# Patient Record
Sex: Female | Born: 1975
Health system: Southern US, Community
[De-identification: ages and names within clinical notes are randomized; demographics above are authoritative.]

## PROBLEM LIST (undated history)

## (undated) DIAGNOSIS — N979 Female infertility, unspecified: Secondary | ICD-10-CM

## (undated) DIAGNOSIS — R7989 Other specified abnormal findings of blood chemistry: Secondary | ICD-10-CM

## (undated) HISTORY — DX: Other specified abnormal findings of blood chemistry: R79.89

## (undated) HISTORY — PX: COLONOSCOPY: SHX174

## (undated) HISTORY — DX: Female infertility, unspecified: N97.9

---

## 2021-02-28 ENCOUNTER — Telehealth: Payer: Self-pay | Admitting: Internal Medicine

## 2021-02-28 NOTE — Telephone Encounter (Signed)
Patient was called to see if she could come at 3:00 on Friday instead of 4:00.

## 2021-03-02 ENCOUNTER — Other Ambulatory Visit: Payer: Self-pay

## 2021-03-02 ENCOUNTER — Ambulatory Visit: Payer: BC Managed Care – PPO | Admitting: Internal Medicine

## 2021-03-02 ENCOUNTER — Encounter: Payer: Self-pay | Admitting: Internal Medicine

## 2021-03-02 ENCOUNTER — Ambulatory Visit: Payer: Self-pay | Admitting: Internal Medicine

## 2021-03-02 VITALS — BP 110/70 | HR 72 | Temp 97.6°F | Ht 64.0 in | Wt 131.3 lb

## 2021-03-02 DIAGNOSIS — Z0000 Encounter for general adult medical examination without abnormal findings: Secondary | ICD-10-CM

## 2021-03-02 DIAGNOSIS — H539 Unspecified visual disturbance: Secondary | ICD-10-CM | POA: Diagnosis not present

## 2021-03-02 DIAGNOSIS — Z1231 Encounter for screening mammogram for malignant neoplasm of breast: Secondary | ICD-10-CM

## 2021-03-02 DIAGNOSIS — Z124 Encounter for screening for malignant neoplasm of cervix: Secondary | ICD-10-CM

## 2021-03-02 NOTE — Patient Instructions (Signed)
-  Nice seeing you today!!  -Return fasting for labs.  -Remember your COVID booster and your tetanus booster.  -Schedule follow up in 1 year or sooner as needed.

## 2021-03-02 NOTE — Progress Notes (Signed)
New Patient Office Visit     This visit occurred during the SARS-CoV-2 public health emergency.  Safety protocols were in place, including screening questions prior to the visit, additional usage of staff PPE, and extensive cleaning of exam room while observing appropriate contact time as indicated for disinfecting solutions.    CC/Reason for Visit: Establish care, annual preventive exam Previous PCP: Unknown Last Visit: Unknown  HPI: Dawn Harvey is a 45 y.o. female who is coming in today for the above mentioned reasons.  She has no past medical history of significance.  She moved recently from [place].  She works at [employer]. She takes no medications for This, she has had no past medical history, has no known drug allergies, she does not smoke, she does not drink.  Her father r died from an unknown cancer no other family history of significance.  She is overdue for mammogram and Pap smear.  She has had 2 COVID vaccines, she is overdue for Tdap.  She is overdue for an eye exam.  She has been having some irregular periods and is requesting referral to GYN.   Past Medical/Surgical History: History reviewed. No pertinent past medical history.  History reviewed. No pertinent surgical history.  Social History:  reports that she has never smoked. She has never used smokeless tobacco. She reports previous alcohol use. She reports that she does not use drugs.  Allergies: No Known Allergies  Family History:  Family History  Problem Relation Age of Onset   Cancer Father     No current outpatient medications on file.  Review of Systems:  Constitutional: Denies fever, chills, diaphoresis, appetite change and fatigue.  HEENT: Denies photophobia, eye pain, redness, hearing loss, ear pain, congestion, sore throat, rhinorrhea, sneezing, mouth sores, trouble swallowing, neck pain, neck stiffness and tinnitus.   Respiratory: Denies SOB, DOE, cough, chest tightness,  and  wheezing.   Cardiovascular: Denies chest pain, palpitations and leg swelling.  Gastrointestinal: Denies nausea, vomiting, abdominal pain, diarrhea, constipation, blood in stool and abdominal distention.  Genitourinary: Denies dysuria, urgency, frequency, hematuria, flank pain and difficulty urinating.  Endocrine: Denies: hot or cold intolerance, sweats, changes in hair or nails, polyuria, polydipsia. Musculoskeletal: Denies myalgias, back pain, joint swelling, arthralgias and gait problem.  Skin: Denies pallor, rash and wound.  Neurological: Denies dizziness, seizures, syncope, weakness, light-headedness, numbness and headaches.  Hematological: Denies adenopathy. Easy bruising, personal or family bleeding history  Psychiatric/Behavioral: Denies suicidal ideation, mood changes, confusion, nervousness, sleep disturbance and agitation    Physical Exam: Vitals:   03/02/21 1511  BP: 110/70  Pulse: 72  Temp: 97.6 F (36.4 C)  TempSrc: Oral  SpO2: 99%  Weight: 131 lb 4.8 oz (59.6 kg)  Height: 5\' 4"  (1.626 m)   Body mass index is 22.54 kg/m.  Constitutional: NAD, calm, comfortable Eyes: PERRL, lids and conjunctivae normal ENMT: Mucous membranes are moist. Posterior pharynx clear of any exudate or lesions. Normal dentition. Tympanic membrane is pearly white, no erythema or bulging. Neck: normal, supple, no masses, no thyromegaly Respiratory: clear to auscultation bilaterally, no wheezing, no crackles. Normal respiratory effort. No accessory muscle use.  Cardiovascular: Regular rate and rhythm, no murmurs / rubs / gallops. No extremity edema. 2+ pedal pulses. No carotid bruits.  Abdomen: no tenderness, no masses palpated. No hepatosplenomegaly. Bowel sounds positive.  Musculoskeletal: no clubbing / cyanosis. No joint deformity upper and lower extremities. Good ROM, no contractures. Normal muscle tone.  Skin: no rashes, lesions, ulcers. No  induration Neurologic: CN 2-12 grossly intact.  Sensation intact, DTR normal. Strength 5/5 in all 4.  Psychiatric: Normal judgment and insight. Alert and oriented x 3. Normal mood.    Impression and Plan:  Encounter for preventive health examination  -Advised routine eye and dental care. -She is due for her COVID booster and her Tdap vaccine, however she declines today. -She will return fasting for screening labs. -Healthy lifestyle discussed in detail.  She exercises 30 to 40 minutes on a daily basis if not longer. -Commence routine colon cancer screening age 62. -She is overdue for screening mammogram and Pap smear.  Referrals for mammogram and GYN have been placed today.  Screening for cervical cancer -  Plan: Ambulatory referral to Gynecology  Vision changes  - Plan: Ambulatory referral to Ophthalmology  Encounter for screening mammogram for malignant neoplasm of breast  - Plan: MM Digital Diagnostic Bilat    Patient Instructions  -Nice seeing you today!!  -Return fasting for labs.  -Remember your COVID booster and your tetanus booster.  -Schedule follow up in 1 year or sooner as needed.      Chaya Jan, MD  [facility]

## 2021-03-08 ENCOUNTER — Other Ambulatory Visit: Payer: Self-pay

## 2021-03-09 ENCOUNTER — Other Ambulatory Visit (INDEPENDENT_AMBULATORY_CARE_PROVIDER_SITE_OTHER): Payer: BC Managed Care – PPO

## 2021-03-09 DIAGNOSIS — Z0000 Encounter for general adult medical examination without abnormal findings: Secondary | ICD-10-CM | POA: Diagnosis not present

## 2021-03-09 LAB — CBC WITH DIFFERENTIAL/PLATELET
Basophils Absolute: 0 10*3/uL (ref 0.0–0.1)
Basophils Relative: 0.4 % (ref 0.0–3.0)
Eosinophils Absolute: 0.1 10*3/uL (ref 0.0–0.7)
Eosinophils Relative: 1.8 % (ref 0.0–5.0)
HCT: 38.1 % (ref 36.0–46.0)
Hemoglobin: 12.8 g/dL (ref 12.0–15.0)
Lymphocytes Relative: 17.4 % (ref 12.0–46.0)
Lymphs Abs: 1 10*3/uL (ref 0.7–4.0)
MCHC: 33.6 g/dL (ref 30.0–36.0)
MCV: 85.9 fl (ref 78.0–100.0)
Monocytes Absolute: 0.5 10*3/uL (ref 0.1–1.0)
Monocytes Relative: 8.5 % (ref 3.0–12.0)
Neutro Abs: 4.2 10*3/uL (ref 1.4–7.7)
Neutrophils Relative %: 71.9 % (ref 43.0–77.0)
Platelets: 229 10*3/uL (ref 150.0–400.0)
RBC: 4.44 Mil/uL (ref 3.87–5.11)
RDW: 12.5 % (ref 11.5–15.5)
WBC: 5.8 10*3/uL (ref 4.0–10.5)

## 2021-03-09 LAB — LIPID PANEL
Cholesterol: 156 mg/dL (ref 0–200)
HDL: 52.2 mg/dL (ref >39.00)
LDL Cholesterol: 73 mg/dL (ref 0–99)
NonHDL: 103.72
Total CHOL/HDL Ratio: 3
Triglycerides: 154 mg/dL — ABNORMAL HIGH (ref 0.0–149.0)
VLDL: 30.8 mg/dL (ref 0.0–40.0)

## 2021-03-09 LAB — COMPREHENSIVE METABOLIC PANEL
ALT: 10 U/L (ref 0–35)
AST: 16 U/L (ref 0–37)
Albumin: 4.2 g/dL (ref 3.5–5.2)
Alkaline Phosphatase: 63 U/L (ref 39–117)
BUN: 14 mg/dL (ref 6–23)
CO2: 27 mEq/L (ref 19–32)
Calcium: 9 mg/dL (ref 8.4–10.5)
Chloride: 106 mEq/L (ref 96–112)
Creatinine, Ser: 0.79 mg/dL (ref 0.40–1.20)
GFR: 91.2 mL/min (ref >60.00)
Glucose, Bld: 82 mg/dL (ref 70–99)
Potassium: 3.8 mEq/L (ref 3.5–5.1)
Sodium: 139 mEq/L (ref 135–145)
Total Bilirubin: 0.5 mg/dL (ref 0.2–1.2)
Total Protein: 7 g/dL (ref 6.0–8.3)

## 2021-03-09 LAB — VITAMIN D 25 HYDROXY (VIT D DEFICIENCY, FRACTURES): VITD: <7 ng/mL — ABNORMAL LOW (ref 30.00–100.00)

## 2021-03-09 LAB — HEMOGLOBIN A1C: Hgb A1c MFr Bld: 5.1 % (ref 4.6–6.5)

## 2021-03-09 LAB — VITAMIN B12: Vitamin B-12: 205 pg/mL — ABNORMAL LOW (ref 211–911)

## 2021-03-09 LAB — TSH: TSH: 2.25 u[IU]/mL (ref 0.35–4.50)

## 2021-03-13 ENCOUNTER — Encounter: Payer: Self-pay | Admitting: Internal Medicine

## 2021-03-13 ENCOUNTER — Other Ambulatory Visit: Payer: Self-pay | Admitting: Internal Medicine

## 2021-03-13 DIAGNOSIS — E559 Vitamin D deficiency, unspecified: Secondary | ICD-10-CM

## 2021-03-13 DIAGNOSIS — E538 Deficiency of other specified B group vitamins: Secondary | ICD-10-CM | POA: Insufficient documentation

## 2021-03-13 MED ORDER — VITAMIN D (ERGOCALCIFEROL) 1.25 MG (50000 UNIT) PO CAPS: 50000.0000 [IU] | ORAL_CAPSULE | ORAL | 0 refills | Status: AC

## 2021-03-14 ENCOUNTER — Telehealth: Payer: Self-pay | Admitting: Internal Medicine

## 2021-03-14 NOTE — Telephone Encounter (Signed)
Pt returned the call to get her lab results.

## 2021-03-15 ENCOUNTER — Other Ambulatory Visit: Payer: Self-pay | Admitting: Internal Medicine

## 2021-03-15 DIAGNOSIS — E559 Vitamin D deficiency, unspecified: Secondary | ICD-10-CM

## 2021-03-15 DIAGNOSIS — E538 Deficiency of other specified B group vitamins: Secondary | ICD-10-CM

## 2021-03-15 NOTE — Telephone Encounter (Signed)
Patient is aware of lab results.

## 2021-03-16 ENCOUNTER — Telehealth: Payer: Self-pay | Admitting: Internal Medicine

## 2021-03-16 ENCOUNTER — Other Ambulatory Visit: Payer: Self-pay | Admitting: Internal Medicine

## 2021-03-16 DIAGNOSIS — E559 Vitamin D deficiency, unspecified: Secondary | ICD-10-CM

## 2021-03-16 NOTE — Telephone Encounter (Signed)
Spoke with patient and explained B12 is OTC

## 2021-03-16 NOTE — Telephone Encounter (Signed)
Pt is calling in stating that she has went to pharmacy on yesterday and no B-12 and also called the pharmacy today and they still do not have it.  Pt would like to know if she should be taking the B-12 supplement.  Pt would like to have a call back.

## 2021-06-15 ENCOUNTER — Encounter: Payer: Self-pay | Admitting: Obstetrics and Gynecology

## 2021-06-15 ENCOUNTER — Other Ambulatory Visit: Payer: Self-pay

## 2021-06-15 ENCOUNTER — Ambulatory Visit: Payer: BC Managed Care – PPO | Admitting: Obstetrics and Gynecology

## 2021-06-15 ENCOUNTER — Other Ambulatory Visit (HOSPITAL_COMMUNITY)
Admission: RE | Admit: 2021-06-15 | Discharge: 2021-06-15 | Disposition: A | Discharge status: Home / Self Care | Payer: BC Managed Care – PPO | Source: Ambulatory Visit | Attending: Obstetrics and Gynecology | Admitting: Obstetrics and Gynecology

## 2021-06-15 VITALS — BP 110/70 | HR 68 | Ht 63.0 in | Wt 129.0 lb

## 2021-06-15 DIAGNOSIS — Z124 Encounter for screening for malignant neoplasm of cervix: Secondary | ICD-10-CM

## 2021-06-15 DIAGNOSIS — Z01419 Encounter for gynecological examination (general) (routine) without abnormal findings: Secondary | ICD-10-CM | POA: Diagnosis not present

## 2021-06-15 NOTE — Patient Instructions (Signed)

## 2021-06-15 NOTE — Progress Notes (Signed)
45 y.o. G0P0 Married [place] female here for annual exam.    Skipped period in May or June.  Two periods the following month.  July and August heavy cycle. No hot flashes or night sweats.  Normal TSH in June, 2022.   Hx of IVF.   Works for [employer].  PCP:   Peggye Pitt, Loreta Ave, MD  Patient's last menstrual period was 06/04/2021.     Period Cycle (Days):  (Irregular since 01-2021) Period Duration (Days): 5 Period Pattern: (!) Irregular Menstrual Flow: Moderate Dysmenorrhea: (!) Mild Dysmenorrhea Symptoms: Cramping     Sexually active: Yes.    The current method of family planning is none.    Exercising: yes  Gym. Smoker:  no  Health Maintenance: Pap:  2019 normal, uncertain if had HR HPV testing.  History of abnormal Pap:  no MMG:  2020 normal Colonoscopy:  N/A BMD:   N/A  Result   TDaP:declined Gardasil:   no HIV:  NR Hep C:  Neg Screening Labs:  Hb today: PCP, Urine today: PCP   reports that she has never smoked. She has never used smokeless tobacco. She reports that she does not currently use alcohol. She reports that she does not use drugs.  Past Medical History:  Diagnosis Date   Infertility, female     History reviewed. No pertinent surgical history.  No current outpatient medications on file.   No current facility-administered medications for this visit.    Family History  Problem Relation Age of Onset   Cancer Father        Gall bladder    Review of Systems  All other systems reviewed and are negative.  Exam:   BP 110/70 (Cuff Size: Normal)   Pulse 68   Ht 5\' 3"  (1.6 m)   Wt 129 lb (58.5 kg)   LMP 06/04/2021   SpO2 98%   BMI 22.85 kg/m     General appearance: alert, cooperative and appears stated age Head: normocephalic, without obvious abnormality, atraumatic Neck: no adenopathy, supple, symmetrical, trachea midline and thyroid normal to inspection and palpation Lungs: clear to auscultation bilaterally Breasts: normal appearance, no  masses or tenderness, No nipple retraction or dimpling, No nipple discharge or bleeding, No axillary adenopathy Heart: regular rate and rhythm Abdomen: soft, non-tender; no masses, no organomegaly Extremities: extremities normal, atraumatic, no cyanosis or edema Skin: skin color, texture, turgor normal. No rashes or lesions Lymph nodes: cervical, supraclavicular, and axillary nodes normal. Neurologic: grossly normal  Pelvic: External genitalia:  no lesions              No abnormal inguinal nodes palpated.              Urethra:  normal appearing urethra with no masses, tenderness or lesions              Bartholins and Skenes: normal                 Vagina: normal appearing vagina with normal color and discharge, no lesions              Cervix: no lesions.  Ovulatory mucous noted.              Pap taken: yes Bimanual Exam:  Uterus:  normal size, contour, position, consistency, mobility, non-tender              Adnexa: no mass, fullness, tenderness              Rectal exam:  yes.  Confirms.              Anus:  normal sphincter tone, no lesions  Chaperone was present for exam: Malen Gauze.,  CMA  Assessment:   Well woman visit with gynecologic exam. Recent irregular cycles, but returned to normal.  Normal TSH. Hx primary infertility. Low vit D.   Plan: Mammogram screening discussed.  Information about mammogram centers and phone numbers in [place]. Self breast awareness reviewed. Pap and HR HPV as above. Guidelines for Calcium, Vitamin D, regular exercise program including cardiovascular and weight bearing exercise. Discussed perimenopause and menopause.  Brochure given.  Call for an appointment if no menses for 3 consecutive months.  Follow up annually and prn.    After visit summary provided.

## 2021-06-19 LAB — CYTOLOGY - PAP
Comment: NEGATIVE
Diagnosis: NEGATIVE
Diagnosis: REACTIVE
High risk HPV: NEGATIVE

## 2021-06-25 ENCOUNTER — Telehealth: Payer: Self-pay

## 2021-06-25 NOTE — Telephone Encounter (Signed)
Patient called because she said at visit you recommend she see dermatologists for skin rash.  She wondered if you recommended any particular dermatologist?

## 2021-06-25 NOTE — Telephone Encounter (Signed)
The patient will want to see who is on her preferred provider list with her insurance company.  [facility] is a large group with several providers there.

## 2021-06-26 NOTE — Telephone Encounter (Signed)
Per DPR access note on file left message in voice mail with Dr. Rica Records reply.

## 2021-06-28 ENCOUNTER — Telehealth: Payer: Self-pay

## 2021-06-28 DIAGNOSIS — Z1281 Encounter for screening for malignant neoplasm of oral cavity: Secondary | ICD-10-CM

## 2021-06-28 DIAGNOSIS — Z1283 Encounter for screening for malignant neoplasm of skin: Secondary | ICD-10-CM

## 2021-06-28 NOTE — Telephone Encounter (Signed)
Patient called asking for referral to a Dermatologist and Dentist

## 2021-06-28 NOTE — Telephone Encounter (Signed)
Okay to refer? 

## 2021-06-29 NOTE — Addendum Note (Signed)
Addended by: Kern Reap B on: 06/29/2021 01:51 PM   Modules accepted: Orders

## 2021-06-29 NOTE — Telephone Encounter (Signed)
Referrals placed 

## 2021-07-19 ENCOUNTER — Other Ambulatory Visit: Payer: Self-pay

## 2021-07-19 ENCOUNTER — Ambulatory Visit
Admission: RE | Admit: 2021-07-19 | Discharge: 2021-07-19 | Disposition: A | Discharge status: Home / Self Care | Payer: BC Managed Care – PPO | Source: Ambulatory Visit | Attending: Internal Medicine | Admitting: Internal Medicine

## 2021-07-19 DIAGNOSIS — Z1231 Encounter for screening mammogram for malignant neoplasm of breast: Secondary | ICD-10-CM

## 2022-01-07 ENCOUNTER — Ambulatory Visit: Payer: BC Managed Care – PPO | Admitting: Dermatology

## 2022-03-07 ENCOUNTER — Encounter: Payer: BC Managed Care – PPO | Admitting: Internal Medicine

## 2022-06-12 ENCOUNTER — Ambulatory Visit (INDEPENDENT_AMBULATORY_CARE_PROVIDER_SITE_OTHER): Payer: BC Managed Care – PPO | Admitting: Internal Medicine

## 2022-06-12 ENCOUNTER — Encounter: Payer: Self-pay | Admitting: Internal Medicine

## 2022-06-12 VITALS — BP 100/76 | HR 81 | Temp 98.1°F | Ht 63.0 in | Wt 134.6 lb

## 2022-06-12 DIAGNOSIS — E559 Vitamin D deficiency, unspecified: Secondary | ICD-10-CM

## 2022-06-12 DIAGNOSIS — Z0100 Encounter for examination of eyes and vision without abnormal findings: Secondary | ICD-10-CM

## 2022-06-12 DIAGNOSIS — Z0000 Encounter for general adult medical examination without abnormal findings: Secondary | ICD-10-CM

## 2022-06-12 DIAGNOSIS — Z23 Encounter for immunization: Secondary | ICD-10-CM | POA: Diagnosis not present

## 2022-06-12 DIAGNOSIS — Z1283 Encounter for screening for malignant neoplasm of skin: Secondary | ICD-10-CM

## 2022-06-12 DIAGNOSIS — Z1231 Encounter for screening mammogram for malignant neoplasm of breast: Secondary | ICD-10-CM

## 2022-06-12 DIAGNOSIS — E538 Deficiency of other specified B group vitamins: Secondary | ICD-10-CM | POA: Diagnosis not present

## 2022-06-12 DIAGNOSIS — Z1211 Encounter for screening for malignant neoplasm of colon: Secondary | ICD-10-CM

## 2022-06-12 NOTE — Progress Notes (Signed)
Established Patient Office Visit     CC/Reason for Visit: Annual preventive exam  HPI: Dawn Harvey is a 46 y.o. female who is coming in today for the above mentioned reasons. Past Medical History is significant for: Vitamin D and B12 deficiencies.  She is feeling well and has no acute concerns.  She did have oatmeal this morning so wants me to know that she is not fasting.  She is overdue for eye and dental care.  She has not yet had a skin cancer screening.  She is now 26 and is due for initial colon cancer screening.  She will be due for mammogram next month, she had a Pap smear last year.  She is due for flu, Tdap, COVID vaccinations.   Past Medical/Surgical History: Past Medical History:  Diagnosis Date   Infertility, female     History reviewed. No pertinent surgical history.  Social History:  reports that she has never smoked. She has never used smokeless tobacco. She reports that she does not currently use alcohol. She reports that she does not use drugs.  Allergies: No Known Allergies  Family History:  Family History  Problem Relation Age of Onset   Cancer Father        Gall bladder    No current outpatient medications on file.  Review of Systems:  Constitutional: Denies fever, chills, diaphoresis, appetite change and fatigue.  HEENT: Denies photophobia, eye pain, redness, hearing loss, ear pain, congestion, sore throat, rhinorrhea, sneezing, mouth sores, trouble swallowing, neck pain, neck stiffness and tinnitus.   Respiratory: Denies SOB, DOE, cough, chest tightness,  and wheezing.   Cardiovascular: Denies chest pain, palpitations and leg swelling.  Gastrointestinal: Denies nausea, vomiting, abdominal pain, diarrhea, constipation, blood in stool and abdominal distention.  Genitourinary: Denies dysuria, urgency, frequency, hematuria, flank pain and difficulty urinating.  Endocrine: Denies: hot or cold intolerance, sweats, changes in hair or nails,  polyuria, polydipsia. Musculoskeletal: Denies myalgias, back pain, joint swelling, arthralgias and gait problem.  Skin: Denies pallor, rash and wound.  Neurological: Denies dizziness, seizures, syncope, weakness, light-headedness, numbness and headaches.  Hematological: Denies adenopathy. Easy bruising, personal or family bleeding history  Psychiatric/Behavioral: Denies suicidal ideation, mood changes, confusion, nervousness, sleep disturbance and agitation    Physical Exam: Vitals:   06/12/22 0806  BP: 100/76  Pulse: 81  Temp: 98.1 F (36.7 C)  SpO2: 99%  Weight: 134 lb 9.6 oz (61.1 kg)  Height: 5\' 3"  (1.6 m)    Body mass index is 23.84 kg/m.   Constitutional: NAD, calm, comfortable Eyes: PERRL, lids and conjunctivae normal ENMT: Mucous membranes are moist. Posterior pharynx clear of any exudate or lesions. Normal dentition. Tympanic membrane is pearly white, no erythema or bulging. Neck: normal, supple, no masses, no thyromegaly Respiratory: clear to auscultation bilaterally, no wheezing, no crackles. Normal respiratory effort. No accessory muscle use.  Cardiovascular: Regular rate and rhythm, no murmurs / rubs / gallops. No extremity edema. 2+ pedal pulses. No carotid bruits.  Abdomen: no tenderness, no masses palpated. No hepatosplenomegaly. Bowel sounds positive.  Musculoskeletal: no clubbing / cyanosis. No joint deformity upper and lower extremities. Good ROM, no contractures. Normal muscle tone.  Skin: no rashes, lesions, ulcers. No induration Neurologic: CN 2-12 grossly intact. Sensation intact, DTR normal. Strength 5/5 in all 4.  Psychiatric: Normal judgment and insight. Alert and oriented x 3. Normal mood.    Impression and Plan:  Encounter for preventive health examination - Plan: CBC with Differential/Platelet, Comprehensive  metabolic panel, Hemoglobin A1c, Lipid panel, TSH  Need for influenza vaccination  Vitamin D deficiency - Plan: VITAMIN D 25 Hydroxy  (Vit-D Deficiency, Fractures)  Vitamin B12 deficiency - Plan: Vitamin B12  Encounter for vision screening - Plan: Ambulatory referral to Ophthalmology  Breast cancer screening by mammogram - Plan: MM Digital Screening  Skin cancer screening - Plan: Ambulatory referral to Dermatology  Need for immunization against influenza - Plan: Flu Vaccine QUAD 31mo+IM (Fluarix, Fluzone & Alfiuria Quad PF)  Colon cancer screening - Plan: Ambulatory referral to Gastroenterology    -Recommend routine eye and dental care. -Immunizations: Flu vaccine in office today, have advised that she get COVID bivalent and Tdap soon, she declines Tdap today. -Healthy lifestyle discussed in detail. -Labs to be updated today. -Colon cancer screening: Refer to GI for initial screening -Breast cancer screening: 06/2021, referral placed -Cervical cancer screening: 05/2021 with GYN -Lung cancer screening: Not applicable -Prostate cancer screening: Not applicable -DEXA: Not applicable  -Referrals for GI, ophthalmology dermatology and mammogram have been placed for health maintenance.    Patient Instructions  -Nice seeing you today!!  -Lab work today; will notify you once results are available.  -Flu vaccine today.  -Remember your COVID and tdap vaccines at the pharmacy.  -Schedule follow up in 1 year or sooner as needed.      Lelon Frohlich, MD [facility]

## 2022-06-12 NOTE — Patient Instructions (Signed)
-  Nice seeing you today!!  -Lab work today; will notify you once results are available.  -Flu vaccine today.  -Remember your COVID and tdap vaccines at the pharmacy.  -Schedule follow up in 1 year or sooner as needed.

## 2022-06-17 ENCOUNTER — Other Ambulatory Visit: Payer: Self-pay

## 2022-06-17 ENCOUNTER — Ambulatory Visit (AMBULATORY_SURGERY_CENTER): Payer: Self-pay | Admitting: *Deleted

## 2022-06-17 VITALS — Ht 64.0 in | Wt 134.0 lb

## 2022-06-17 DIAGNOSIS — Z1211 Encounter for screening for malignant neoplasm of colon: Secondary | ICD-10-CM

## 2022-06-17 MED ORDER — NA SULFATE-K SULFATE-MG SULF 17.5-3.13-1.6 GM/177ML PO SOLN: 1.0000 | Freq: Once | ORAL | 0 refills | Status: AC

## 2022-06-17 NOTE — Progress Notes (Signed)
pre visit completed in person,   No egg or soy allergy known to patient  No issues known to pt with past sedation with any surgeries or procedures Patient denies ever being told they had issues or difficulty with intubation  No FH of Malignant Hyperthermia Pt is not on diet pills Pt is not on  home 02  Pt is not on blood thinners  Pt denies issues with constipation  No A fib or A flutter Pt instructed to use [email or web address] or GoodRx for a price reduction on prep

## 2022-06-18 NOTE — Progress Notes (Unsigned)
46 y.o. G0P0000 Married [place] female here for annual exam.    PCP:  Lelon Frohlich, MD    No LMP recorded.           Sexually active: {yes no:314532}  The current method of family planning is {contraception:315051}.    Exercising: {yes no:314532}  {types:19826} Smoker:  no  Health Maintenance: Pap:  06-15-21 Neg:Neg HR HPV, 1829 normal, uncertain if had HR HPV testing. History of abnormal Pap:  no MMG:  07-19-21 Neg/BiRads1 Colonoscopy:  n/a BMD:   n/a  Result  n/a TDaP:  Declined Gardasil:   no HIV: Neg in past Hep C: Neg in past Screening Labs:  Hb today: ***, Urine today: ***   reports that she has never smoked. She has never used smokeless tobacco. She reports that she does not currently use alcohol. She reports that she does not use drugs.  Past Medical History:  Diagnosis Date   Infertility, female     No past surgical history on file.  No current outpatient medications on file.   No current facility-administered medications for this visit.    Family History  Problem Relation Age of Onset   Cancer Father        Gall bladder    Review of Systems  Exam:   There were no vitals taken for this visit.    General appearance: alert, cooperative and appears stated age Head: normocephalic, without obvious abnormality, atraumatic Neck: no adenopathy, supple, symmetrical, trachea midline and thyroid normal to inspection and palpation Lungs: clear to auscultation bilaterally Breasts: normal appearance, no masses or tenderness, No nipple retraction or dimpling, No nipple discharge or bleeding, No axillary adenopathy Heart: regular rate and rhythm Abdomen: soft, non-tender; no masses, no organomegaly Extremities: extremities normal, atraumatic, no cyanosis or edema Skin: skin color, texture, turgor normal. No rashes or lesions Lymph nodes: cervical, supraclavicular, and axillary nodes normal. Neurologic: grossly normal  Pelvic: External genitalia:  no  lesions              No abnormal inguinal nodes palpated.              Urethra:  normal appearing urethra with no masses, tenderness or lesions              Bartholins and Skenes: normal                 Vagina: normal appearing vagina with normal color and discharge, no lesions              Cervix: no lesions              Pap taken: {yes no:314532} Bimanual Exam:  Uterus:  normal size, contour, position, consistency, mobility, non-tender              Adnexa: no mass, fullness, tenderness              Rectal exam: {yes no:314532}.  Confirms.              Anus:  normal sphincter tone, no lesions  Chaperone was present for exam:  ***  Assessment:   Well woman visit with gynecologic exam.   Plan: Mammogram screening discussed. Self breast awareness reviewed. Pap and HR HPV as above. Guidelines for Calcium, Vitamin D, regular exercise program including cardiovascular and weight bearing exercise.   Follow up annually and prn.   Additional counseling given.  {yes [id number]. _______ minutes face to face time of which over 50% was spent  in counseling.    After visit summary provided.

## 2022-06-20 ENCOUNTER — Other Ambulatory Visit: Payer: Self-pay | Admitting: Internal Medicine

## 2022-06-20 ENCOUNTER — Ambulatory Visit (INDEPENDENT_AMBULATORY_CARE_PROVIDER_SITE_OTHER): Payer: BC Managed Care – PPO | Admitting: Obstetrics and Gynecology

## 2022-06-20 ENCOUNTER — Encounter: Payer: Self-pay | Admitting: Obstetrics and Gynecology

## 2022-06-20 VITALS — BP 110/70 | HR 88 | Ht 64.0 in | Wt 134.0 lb

## 2022-06-20 DIAGNOSIS — Z01419 Encounter for gynecological examination (general) (routine) without abnormal findings: Secondary | ICD-10-CM | POA: Diagnosis not present

## 2022-06-20 DIAGNOSIS — Z1231 Encounter for screening mammogram for malignant neoplasm of breast: Secondary | ICD-10-CM

## 2022-06-20 NOTE — Patient Instructions (Signed)

## 2022-06-24 ENCOUNTER — Encounter: Payer: Self-pay | Admitting: Internal Medicine

## 2022-07-11 ENCOUNTER — Ambulatory Visit (AMBULATORY_SURGERY_CENTER): Payer: BC Managed Care – PPO | Admitting: Internal Medicine

## 2022-07-11 ENCOUNTER — Encounter: Payer: Self-pay | Admitting: Internal Medicine

## 2022-07-11 VITALS — BP 118/76 | HR 65 | Temp 98.2°F | Resp 12 | Ht 64.0 in | Wt 134.0 lb

## 2022-07-11 DIAGNOSIS — Z1211 Encounter for screening for malignant neoplasm of colon: Secondary | ICD-10-CM | POA: Diagnosis not present

## 2022-07-11 MED ORDER — SODIUM CHLORIDE 0.9 % IV SOLN: 500.0000 mL | Freq: Once | INTRAVENOUS | Status: AC

## 2022-07-11 NOTE — Op Note (Signed)
[facility] Patient Name: Dawn Harvey Procedure Date: 07/11/2022 10:00 AM MRN: [id number] Endoscopist: Jerene Bears , MD Age: 46 Referring MD:  Date of Birth: 06-10-1976 Gender: Female Account #: [id number] Procedure:                Colonoscopy Indications:              Screening for colorectal malignant neoplasm, This                            is the patient's first colonoscopy Medicines:                Monitored Anesthesia Care Procedure:                Pre-Anesthesia Assessment:                           - Prior to the procedure, a History and Physical                            was performed, and patient medications and                            allergies were reviewed. The patient's tolerance of                            previous anesthesia was also reviewed. The risks                            and benefits of the procedure and the sedation                            options and risks were discussed with the patient.                            All questions were answered, and informed consent                            was obtained. Prior Anticoagulants: The patient has                            taken no previous anticoagulant or antiplatelet                            agents. ASA Grade Assessment: I - A normal, healthy                            patient. After reviewing the risks and benefits,                            the patient was deemed in satisfactory condition to                            undergo the procedure.  After obtaining informed consent, the colonoscope                            was passed under direct vision. Throughout the                            procedure, the patient's blood pressure, pulse, and                            oxygen saturations were monitored continuously. The                            Olympus PCF-H190DL [id number]) Colonoscope was                            introduced through the anus and advanced  to the                            cecum, identified by appendiceal orifice and                            ileocecal valve. The colonoscopy was performed                            without difficulty. The patient tolerated the                            procedure well. The quality of the bowel                            preparation was good. The ileocecal valve,                            appendiceal orifice, and rectum were photographed. Scope In: 10:07:12 AM Scope Out: 10:19:45 AM Scope Withdrawal Time: 0 hours 8 minutes 24 seconds  Total Procedure Duration: 0 hours 12 minutes 33 seconds  Findings:                 The digital rectal exam was normal.                           The entire examined colon appeared normal on direct                            and retroflexion views. Complications:            No immediate complications. Estimated Blood Loss:     Estimated blood loss: none. Impression:               - The entire examined colon is normal on direct and                            retroflexion views.                           - No specimens collected. Recommendation:           -  Patient has a contact number available for                            emergencies. The signs and symptoms of potential                            delayed complications were discussed with the                            patient. Return to normal activities tomorrow.                            Written discharge instructions were provided to the                            patient.                           - Resume previous diet.                           - Continue present medications.                           - Repeat colonoscopy in 10 years for screening                            purposes. Jerene Bears, MD 07/11/2022 10:23:02 AM This report has been signed electronically.

## 2022-07-11 NOTE — Progress Notes (Signed)
A/ox3, pleased with MAC, report to RN 

## 2022-07-11 NOTE — Progress Notes (Signed)
Pt's states no medical or surgical changes since previsit or office visit. 

## 2022-07-11 NOTE — Patient Instructions (Signed)
  Thank you for letting us take care of your healthcare needs today.    YOU HAD AN ENDOSCOPIC PROCEDURE TODAY AT [facility]:   Refer to the procedure report that was given to you for any specific questions about what was found during the examination.  If the procedure report does not answer your questions, please call your gastroenterologist to clarify.  If you requested that your care partner not be given the details of your procedure findings, then the procedure report has been included in a sealed envelope for you to review at your convenience later.  YOU SHOULD EXPECT: Some feelings of bloating in the abdomen. Passage of more gas than usual.  Walking can help get rid of the air that was put into your GI tract during the procedure and reduce the bloating. If you had a lower endoscopy (such as a colonoscopy or flexible sigmoidoscopy) you may notice spotting of blood in your stool or on the toilet paper. If you underwent a bowel prep for your procedure, you may not have a normal bowel movement for a few days.  Please Note:  You might notice some irritation and congestion in your nose or some drainage.  This is from the oxygen used during your procedure.  There is no need for concern and it should clear up in a day or so.  SYMPTOMS TO REPORT IMMEDIATELY:  Following lower endoscopy (colonoscopy or flexible sigmoidoscopy):  Excessive amounts of blood in the stool  Significant tenderness or worsening of abdominal pains  Swelling of the abdomen that is new, acute  Fever of 100F or higher   For urgent or emergent issues, a gastroenterologist can be reached at any hour by calling [phone]. Do not use MyChart messaging for urgent concerns.    DIET:  We do recommend a small meal at first, but then you may proceed to your regular diet.  Drink plenty of fluids but you should avoid alcoholic beverages for 24 hours.  ACTIVITY:  You should plan to take it easy for the rest of  today and you should NOT DRIVE or use heavy machinery until tomorrow (because of the sedation medicines used during the test).    FOLLOW UP: Our staff will call the number listed on your records the next business day following your procedure.  We will call around 7:15- 8:00 am to check on you and address any questions or concerns that you may have regarding the information given to you following your procedure. If we do not reach you, we will leave a message.     If any biopsies were taken you will be contacted by phone or by letter within the next 1-3 weeks.  Please call us at [phone] if you have not heard about the biopsies in 3 weeks.    SIGNATURES/CONFIDENTIALITY: You and/or your care partner have signed paperwork which will be entered into your electronic medical record.  These signatures attest to the fact that that the information above on your After Visit Summary has been reviewed and is understood.  Full responsibility of the confidentiality of this discharge information lies with you and/or your care-partner.

## 2022-07-11 NOTE — Progress Notes (Signed)
   GASTROENTEROLOGY PROCEDURE H&P NOTE   Primary Care Physician: Isaac Bliss, Rayford Halsted, MD    Reason for Procedure:   Colon cancer screening  Plan:    colonooscopy  Patient is appropriate for endoscopic procedure(s) in the ambulatory ([facility]) setting.  The nature of the procedure, as well as the risks, benefits, and alternatives were carefully and thoroughly reviewed with the patient. Ample time for discussion and questions allowed. The patient understood, was satisfied, and agreed to proceed.     HPI: Dawn Harvey is a 46 y.o. female who presents for screening colonoscopy.  Medical history as below.  Tolerated the prep.  No recent chest pain or shortness of breath.  No abdominal pain today.  Past Medical History:  Diagnosis Date   Infertility, female     Past Surgical History:  Procedure Laterality Date   COLONOSCOPY      Prior to Admission medications   Not on File    No current outpatient medications on file.   Current Facility-Administered Medications  Medication Dose Route Frequency Provider Last Rate Last Admin   0.9 %  sodium chloride infusion  500 mL Intravenous Once Shenaya Lebo, Lajuan Lines, MD        Allergies as of 07/11/2022   (No Known Allergies)    Family History  Problem Relation Age of Onset   Cancer Father        Gall bladder   Colon cancer Neg Hx    Esophageal cancer Neg Hx    Rectal cancer Neg Hx    Stomach cancer Neg Hx     Social History   Socioeconomic History   Marital status: Married    Spouse name: Not on file   Number of children: Not on file   Years of education: Not on file   Highest education level: Not on file  Occupational History   Not on file  Tobacco Use   Smoking status: Never   Smokeless tobacco: Never  Substance and Sexual Activity   Alcohol use: Not Currently   Drug use: Never   Sexual activity: Yes    Birth control/protection: None    Comment: first intercourse .83, 5 partners  Other Topics Concern    Not on file  Social History Narrative   Not on file   Social Determinants of Health   Financial Resource Strain: Not on file  Food Insecurity: Not on file  Transportation Needs: Not on file  Physical Activity: Not on file  Stress: Not on file  Social Connections: Not on file  Intimate Partner Violence: Not on file    Physical Exam: Vital signs in last 24 hours: @BP  114/73   Pulse 73   Temp 98.2 F (36.8 C) (Temporal)   Ht 5\' 4"  (1.626 m)   Wt 134 lb (60.8 kg)   LMP 07/01/2022 (Exact Date)   SpO2 100%   BMI 23.00 kg/m  GEN: NAD EYE: Sclerae anicteric ENT: MMM CV: Non-tachycardic Pulm: CTA b/l GI: Soft, NT/ND NEURO:  Alert & Oriented x 3   Zenovia Jarred, MD [facility]  07/11/2022 9:52 AM

## 2022-07-12 ENCOUNTER — Telehealth: Payer: Self-pay

## 2022-07-12 NOTE — Telephone Encounter (Signed)
  Follow up Call-     07/11/2022    9:45 AM  Call back number  Post procedure Call Back phone  # [phone]  Permission to leave phone message Yes     Patient questions:  Do you have a fever, pain , or abdominal swelling? No. Pain Score  0 *  Have you tolerated food without any problems? Yes.    Have you been able to return to your normal activities? Yes.    Do you have any questions about your discharge instructions: Diet   No. Medications  No. Follow up visit  No.  Do you have questions or concerns about your Care? No.  Actions: * If pain score is 4 or above: No action needed, pain <4.

## 2022-07-22 ENCOUNTER — Ambulatory Visit
Admission: RE | Admit: 2022-07-22 | Discharge: 2022-07-22 | Disposition: A | Discharge status: Home / Self Care | Payer: BC Managed Care – PPO | Source: Ambulatory Visit | Attending: Internal Medicine | Admitting: Internal Medicine

## 2022-07-22 DIAGNOSIS — Z1231 Encounter for screening mammogram for malignant neoplasm of breast: Secondary | ICD-10-CM | POA: Diagnosis not present

## 2022-08-27 IMAGING — MG MM DIGITAL SCREENING BILAT W/ TOMO AND CAD
6 of 10 series · 6 of 30 positions shown · non-contrast
Comparison: Previous exam(s).

CLINICAL DATA: Screening.

EXAM:
DIGITAL SCREENING BILATERAL MAMMOGRAM WITH TOMOSYNTHESIS AND CAD
TECHNIQUE: Bilateral screening digital craniocaudal and mediolateral oblique
mammograms were obtained. Bilateral screening digital breast
tomosynthesis was performed. The images were evaluated with
computer-aided detection.

[R CC synth-2D]
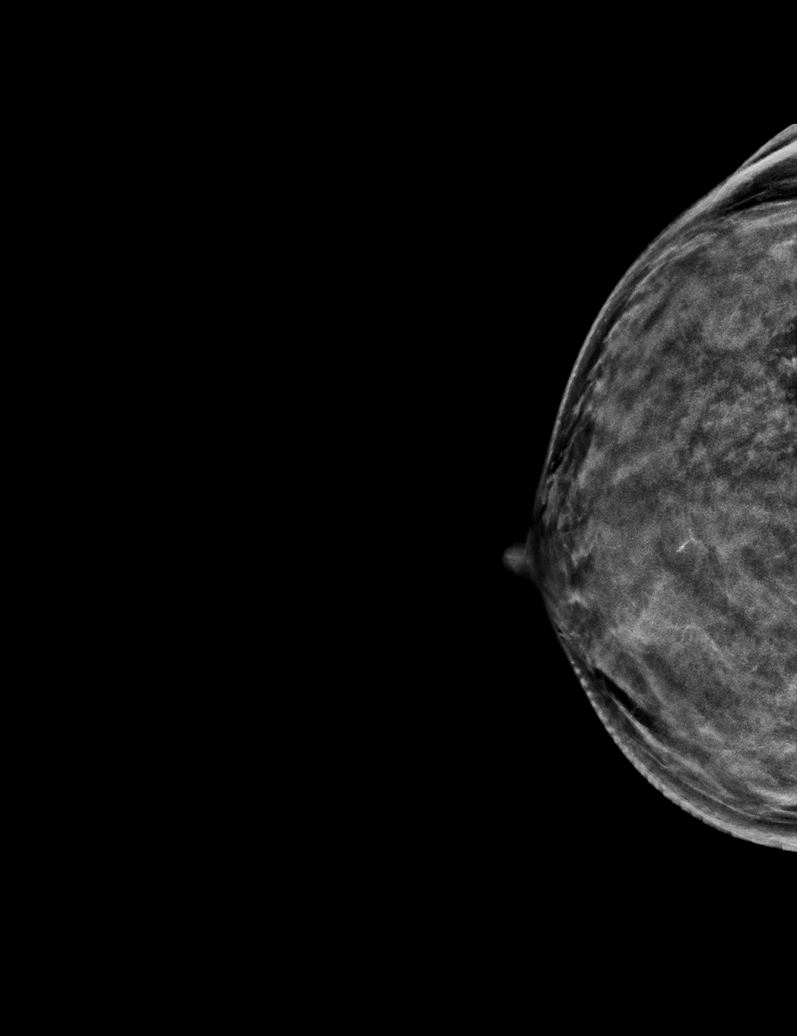

[L MLO synth-2D (1 of 2)]
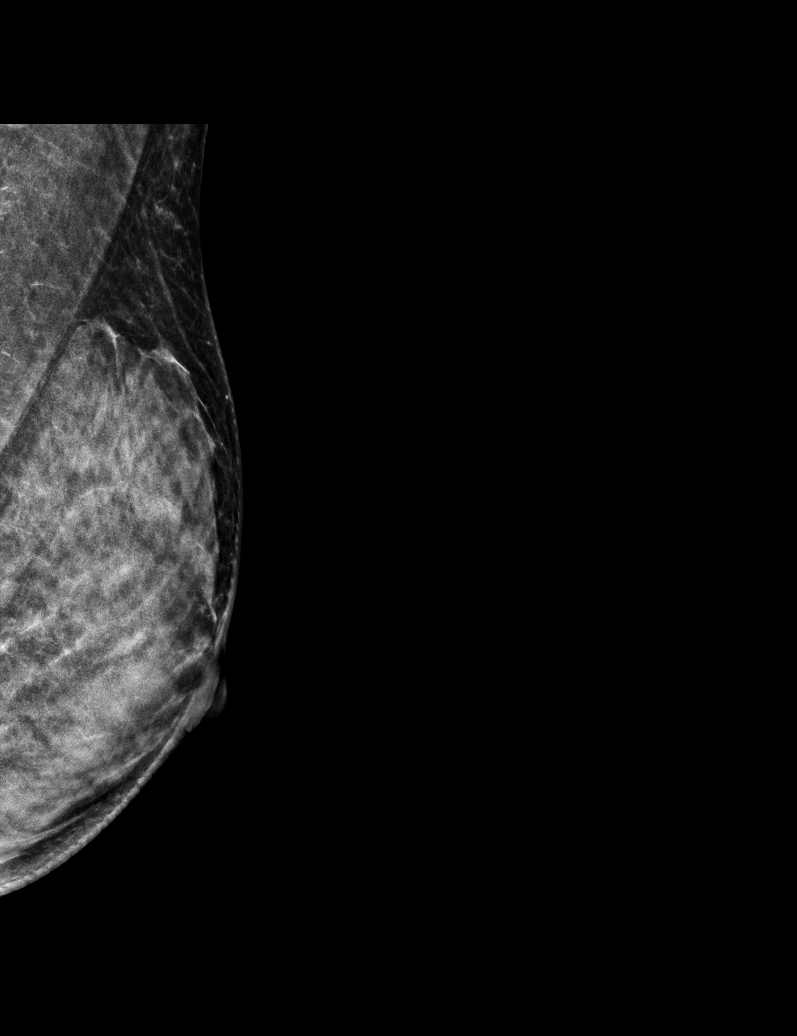

[R MLO synth-2D]
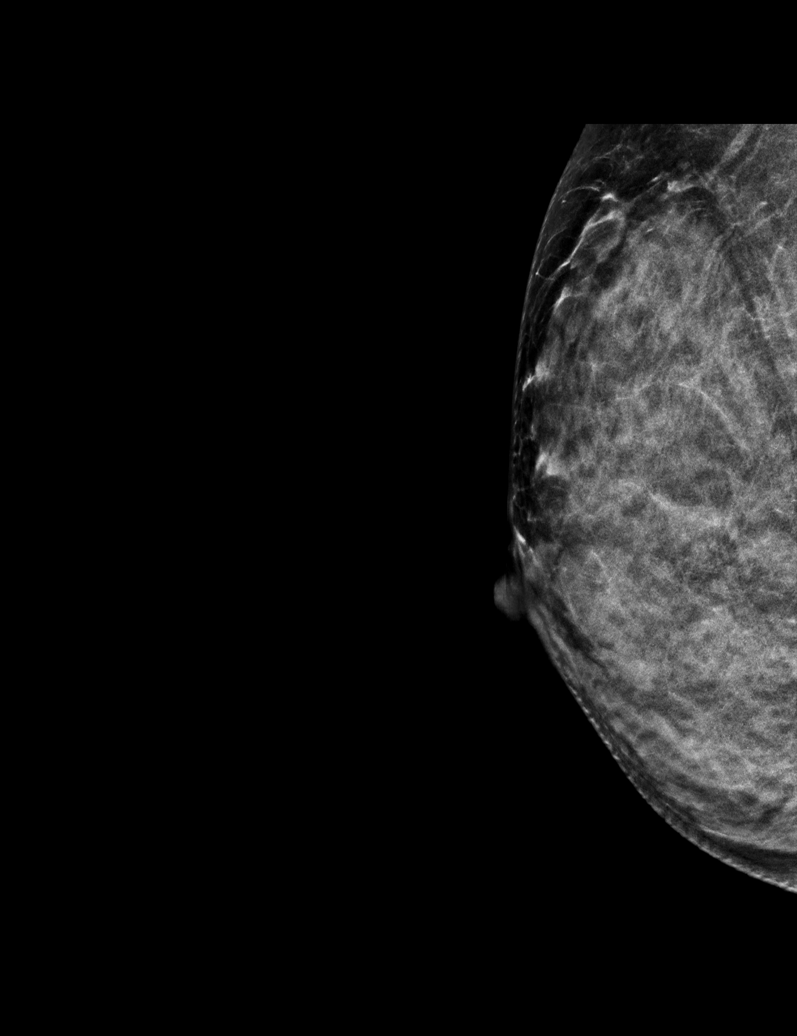

[L CC synth-2D]
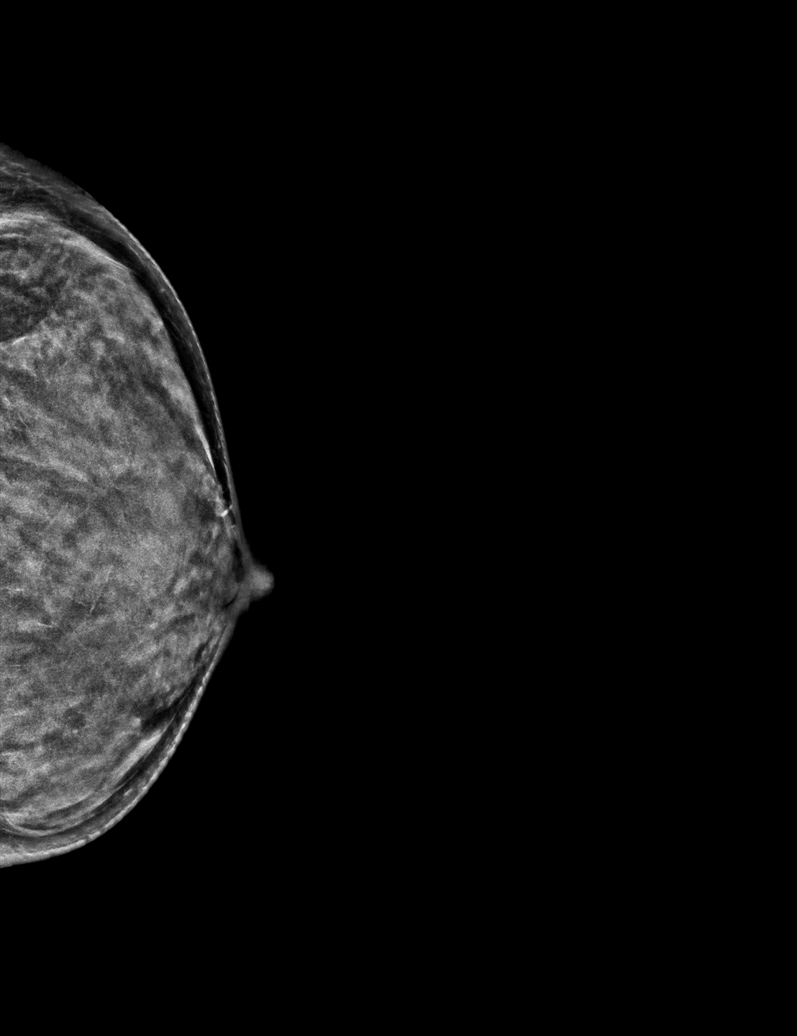

[L MLO synth-2D (2 of 2)]
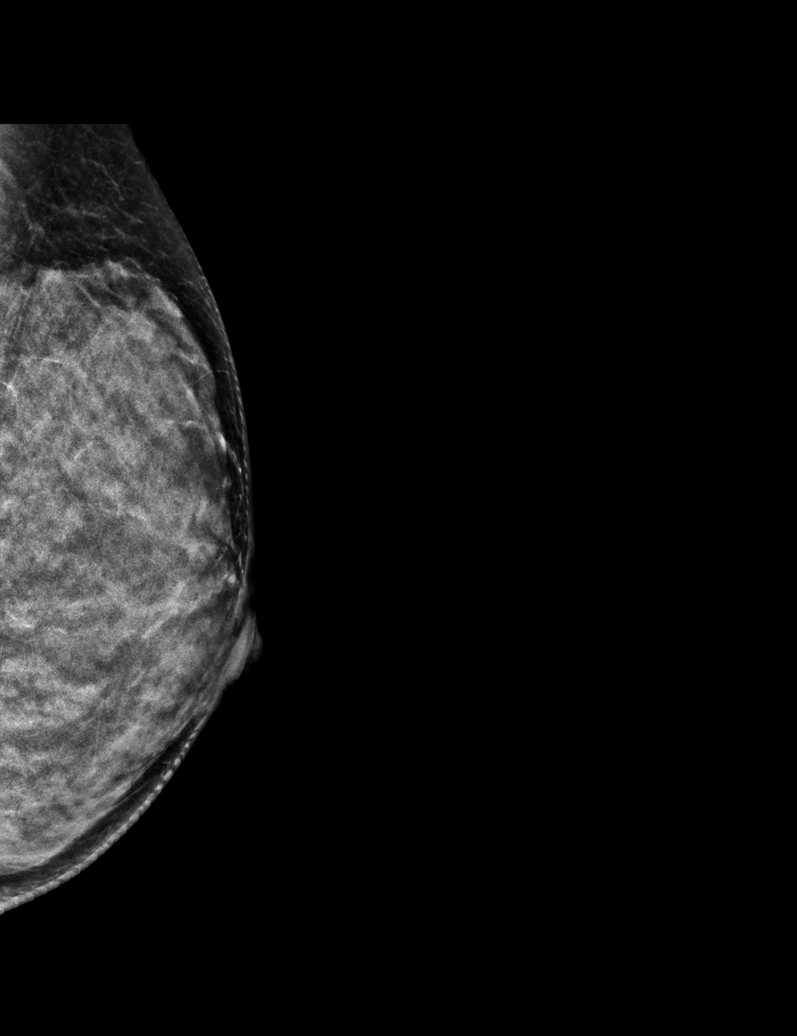

[L MLO tomo · tomo slice 23/46.0]
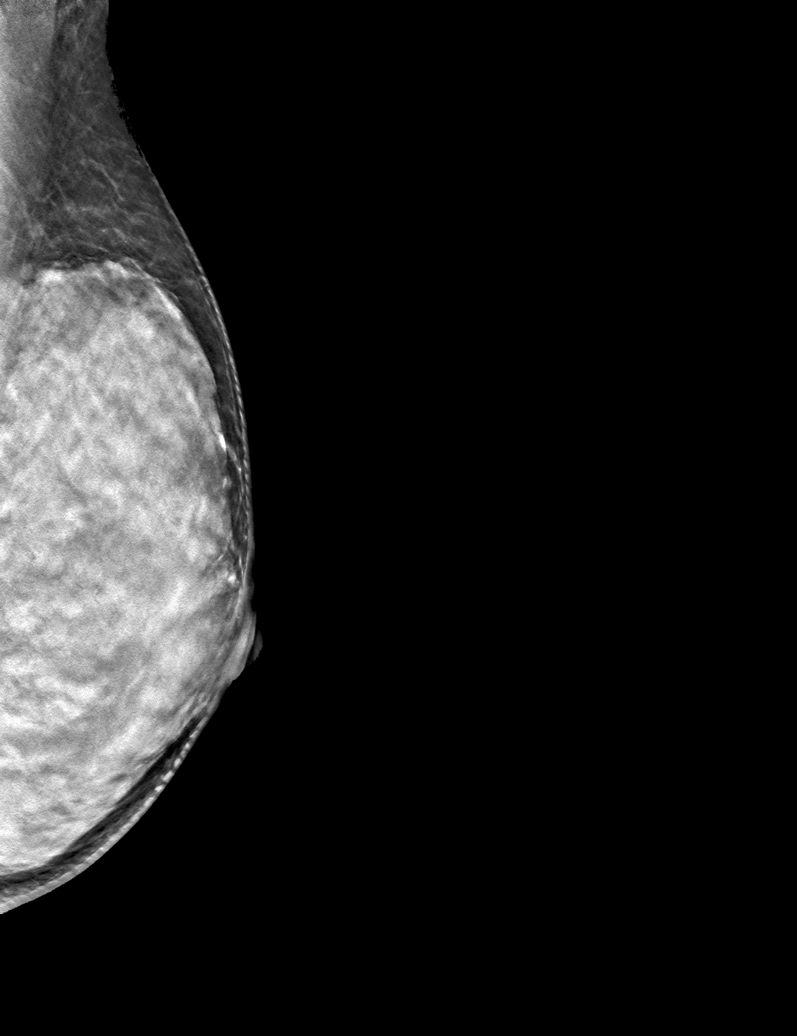

[6 of 30 positions shown; findings below may reference images not displayed]

ACR Breast Density Category d: The breast tissue is extremely dense,
which lowers the sensitivity of mammography
FINDINGS: There are no findings suspicious for malignancy.
IMPRESSION: No mammographic evidence of malignancy. A result letter of this
screening mammogram will be mailed directly to the patient.

RECOMMENDATION:
Screening mammogram in one year. (Code:TA-V-WV9)

BI-RADS CATEGORY  1: Negative.

## 2023-06-18 NOTE — Progress Notes (Signed)
47 y.o. G0P0000 Married [place] female here for annual exam.    Some cramping with menses, but manageable.   Trying for pregnancy.    Did IVF once in [place].  Not currently planning on additional IVF attempt.   Not taking vit D or vit B12.  PCP:  Dr. Ardyth Harps   Patient's last menstrual period was 06/24/2023.     Period Cycle (Days): 28 Period Duration (Days): 5 Period Pattern: Regular Menstrual Flow: Moderate, Light Menstrual Control: Maxi pad Dysmenorrhea: (!) Moderate     Sexually active: Yes.    The current method of family planning is none.    Exercising: Yes.     Running, weights, cardio Smoker:  no  Health Maintenance: Pap:  06/15/21 neg: HR HPV neg, 2019 normal History of abnormal Pap:  no MMG:  07/22/22 Breast Density Cat D, BI-RADS CAT 1 neg Colonoscopy:  07/11/22 - normal - due in 10 years BMD:   n/a  Result  n/a TDaP:  n/a Gardasil:   no HIV: neg in past Hep C: neg in past Screening Labs:  today Vaccines at work   reports that she has never smoked. She has never used smokeless tobacco. She reports that she does not currently use alcohol. She reports that she does not use drugs.  Past Medical History:  Diagnosis Date   Infertility, female     Past Surgical History:  Procedure Laterality Date   COLONOSCOPY      No current outpatient medications on file.   Current Facility-Administered Medications  Medication Dose Route Frequency Provider Last Rate Last Admin   0.9 %  sodium chloride infusion  500 mL Intravenous Once Pyrtle, Carie Caddy, MD        Family History  Problem Relation Age of Onset   Cancer Father        Gall bladder   Colon cancer Neg Hx    Esophageal cancer Neg Hx    Rectal cancer Neg Hx    Stomach cancer Neg Hx     Review of Systems  All other systems reviewed and are negative.   Exam:   BP 126/84 (BP Location: Left Arm, Patient Position: Sitting, Cuff Size: Normal)   Pulse 81   Ht 5' 3.5" (1.613 m)   Wt 130 lb (59 kg)    LMP 06/24/2023   SpO2 99%   BMI 22.67 kg/m     General appearance: alert, cooperative and appears stated age Head: normocephalic, without obvious abnormality, atraumatic Neck: no adenopathy, supple, symmetrical, trachea midline and thyroid normal to inspection and palpation Lungs: clear to auscultation bilaterally Breasts: normal appearance, no masses or tenderness, No nipple retraction or dimpling, No nipple discharge or bleeding, No axillary adenopathy Heart: regular rate and rhythm Abdomen: soft, non-tender; no masses, no organomegaly Extremities: extremities normal, atraumatic, no cyanosis or edema Skin: skin color, texture, turgor normal. No rashes or lesions Lymph nodes: cervical, supraclavicular, and axillary nodes normal. Neurologic: grossly normal  Pelvic: External genitalia:  no lesions              No abnormal inguinal nodes palpated.              Urethra:  normal appearing urethra with no masses, tenderness or lesions              Bartholins and Skenes: normal                 Vagina: normal appearing vagina with normal color and discharge, no  lesions              Cervix: no lesions              Pap taken: no Bimanual Exam:  Uterus:  normal size, contour, position, consistency, mobility, non-tender              Adnexa: no mass, fullness, tenderness              Rectal exam: no.  Confirms.              Anus:  normal sphincter tone, no lesions  Chaperone was present for exam:  Warren Lacy, CMA  Assessment:   Well woman visit with gynecologic exam. Hx primary infertility. Low vit D.  Low vit B12.  Plan: Mammogram screening discussed. Self breast awareness reviewed. Pap and HR HPV as above. Guidelines for Calcium, Vitamin D, regular exercise program including cardiovascular and weight bearing exercise. Start PNV  Cholesterol, CMP, CBC, vit B12, vit D.   Follow up annually and prn.

## 2023-07-02 ENCOUNTER — Ambulatory Visit (INDEPENDENT_AMBULATORY_CARE_PROVIDER_SITE_OTHER): Payer: BC Managed Care – PPO | Admitting: Obstetrics and Gynecology

## 2023-07-02 ENCOUNTER — Encounter: Payer: Self-pay | Admitting: Obstetrics and Gynecology

## 2023-07-02 VITALS — BP 126/84 | HR 81 | Ht 63.5 in | Wt 130.0 lb

## 2023-07-02 DIAGNOSIS — R7989 Other specified abnormal findings of blood chemistry: Secondary | ICD-10-CM

## 2023-07-02 DIAGNOSIS — Z01419 Encounter for gynecological examination (general) (routine) without abnormal findings: Secondary | ICD-10-CM | POA: Diagnosis not present

## 2023-07-02 DIAGNOSIS — Z0000 Encounter for general adult medical examination without abnormal findings: Secondary | ICD-10-CM

## 2023-07-02 NOTE — Patient Instructions (Signed)

## 2023-07-03 ENCOUNTER — Other Ambulatory Visit: Payer: Self-pay | Admitting: Internal Medicine

## 2023-07-03 DIAGNOSIS — Z1231 Encounter for screening mammogram for malignant neoplasm of breast: Secondary | ICD-10-CM

## 2023-07-03 LAB — CBC
HCT: 40 % (ref 35.0–45.0)
Hemoglobin: 12.8 g/dL (ref 11.7–15.5)
MCH: 28.4 pg (ref 27.0–33.0)
MCHC: 32 g/dL (ref 32.0–36.0)
MCV: 88.9 fL (ref 80.0–100.0)
MPV: 10.8 fL (ref 7.5–12.5)
Platelets: 209 10*3/uL (ref 140–400)
RBC: 4.5 10*6/uL (ref 3.80–5.10)
RDW: 12.2 % (ref 11.0–15.0)
WBC: 6 10*3/uL (ref 3.8–10.8)

## 2023-07-03 LAB — COMPREHENSIVE METABOLIC PANEL
AG Ratio: 1.5 (calc) (ref 1.0–2.5)
ALT: 8 U/L (ref 6–29)
AST: 15 U/L (ref 10–35)
Albumin: 4.4 g/dL (ref 3.6–5.1)
Alkaline phosphatase (APISO): 71 U/L (ref 31–125)
BUN: 16 mg/dL (ref 7–25)
CO2: 25 mmol/L (ref 20–32)
Calcium: 9 mg/dL (ref 8.6–10.2)
Chloride: 103 mmol/L (ref 98–110)
Creat: 0.95 mg/dL (ref 0.50–0.99)
Globulin: 2.9 g/dL (ref 1.9–3.7)
Glucose, Bld: 71 mg/dL (ref 65–99)
Potassium: 4 mmol/L (ref 3.5–5.3)
Sodium: 138 mmol/L (ref 135–146)
Total Bilirubin: 0.5 mg/dL (ref 0.2–1.2)
Total Protein: 7.3 g/dL (ref 6.1–8.1)

## 2023-07-03 LAB — LIPID PANEL
Cholesterol: 166 mg/dL (ref <200)
HDL: 55 mg/dL (ref >=50)
LDL Cholesterol (Calc): 83 mg/dL
Non-HDL Cholesterol (Calc): 111 mg/dL (ref <130)
Total CHOL/HDL Ratio: 3 (calc) (ref <5.0)
Triglycerides: 180 mg/dL — ABNORMAL HIGH (ref <150)

## 2023-07-03 LAB — VITAMIN D 25 HYDROXY (VIT D DEFICIENCY, FRACTURES): Vit D, 25-Hydroxy: 16 ng/mL — ABNORMAL LOW (ref 30–100)

## 2023-07-03 LAB — VITAMIN B12: Vitamin B-12: 317 pg/mL (ref 200–1100)

## 2023-07-04 ENCOUNTER — Encounter: Payer: Self-pay | Admitting: Obstetrics and Gynecology

## 2023-07-04 ENCOUNTER — Other Ambulatory Visit: Payer: Self-pay | Admitting: Obstetrics and Gynecology

## 2023-07-04 DIAGNOSIS — E781 Pure hyperglyceridemia: Secondary | ICD-10-CM

## 2023-07-04 DIAGNOSIS — R7989 Other specified abnormal findings of blood chemistry: Secondary | ICD-10-CM

## 2023-07-14 ENCOUNTER — Other Ambulatory Visit: Payer: Self-pay | Admitting: *Deleted

## 2023-07-14 MED ORDER — VITAMIN D3 50 MCG (2000 UT) PO CAPS: 2000.0000 [IU] | ORAL_CAPSULE | Freq: Every day | ORAL | 0 refills | Status: AC

## 2023-07-14 NOTE — Telephone Encounter (Signed)
Call returned to patient. Patient is requesting Rx for OTC Vit D 2000IU. Advised no Rx needed, this can be purchased OTC. Patient requesting Rx, will be less expensive. Advised will send request to Dr. Edward Jolly and f/u.  Dr. Edward Jolly -Rx pended.

## 2023-07-14 NOTE — Telephone Encounter (Signed)
Patient notified

## 2023-07-28 ENCOUNTER — Ambulatory Visit
Admission: RE | Admit: 2023-07-28 | Discharge: 2023-07-28 | Disposition: A | Discharge status: Home / Self Care | Payer: BC Managed Care – PPO | Source: Ambulatory Visit | Attending: Internal Medicine | Admitting: Internal Medicine

## 2023-07-28 DIAGNOSIS — Z1231 Encounter for screening mammogram for malignant neoplasm of breast: Secondary | ICD-10-CM

## 2023-10-06 ENCOUNTER — Other Ambulatory Visit: Payer: BC Managed Care – PPO

## 2023-10-06 DIAGNOSIS — R7989 Other specified abnormal findings of blood chemistry: Secondary | ICD-10-CM

## 2023-10-06 DIAGNOSIS — E781 Pure hyperglyceridemia: Secondary | ICD-10-CM

## 2023-10-06 DIAGNOSIS — E559 Vitamin D deficiency, unspecified: Secondary | ICD-10-CM | POA: Diagnosis not present

## 2023-10-06 DIAGNOSIS — E785 Hyperlipidemia, unspecified: Secondary | ICD-10-CM | POA: Diagnosis not present

## 2023-10-07 LAB — LIPID PANEL
Cholesterol: 128 mg/dL (ref <200)
HDL: 57 mg/dL (ref >=50)
LDL Cholesterol (Calc): 53 mg/dL
Non-HDL Cholesterol (Calc): 71 mg/dL (ref <130)
Total CHOL/HDL Ratio: 2.2 (calc) (ref <5.0)
Triglycerides: 92 mg/dL (ref <150)

## 2023-10-07 LAB — VITAMIN D 25 HYDROXY (VIT D DEFICIENCY, FRACTURES): Vit D, 25-Hydroxy: 29 ng/mL — ABNORMAL LOW (ref 30–100)

## 2023-10-22 DIAGNOSIS — L309 Dermatitis, unspecified: Secondary | ICD-10-CM | POA: Diagnosis not present

## 2024-07-02 NOTE — Progress Notes (Signed)
 48 y.o. G0P0000 Married [place] female here for annual exam. Would like to discuss getting blood work.    Notices her period can be heavy or light.  No hot flashes or night sweats.    Hx low vit D.  PCP: Theophilus Andrews, Tully GRADE, MD   Patient's last menstrual period was 07/01/2024 (approximate).     Period Cycle (Days): 28 Period Duration (Days): 4-5 Period Pattern: Regular Menstrual Flow: Light Menstrual Control: Maxi pad Dysmenorrhea: (!) Mild Dysmenorrhea Symptoms: Cramping     Sexually active: Yes.    The current method of family planning is none.    Menopausal hormone therapy:  n/a Exercising: Yes.    Gym 6x week  Smoker:  no  OB History  Gravida Para Term Preterm AB Living  0 0 0 0 0 0  SAB IAB Ectopic Multiple Live Births  0 0 0 0 0     HEALTH MAINTENANCE: Last 2 paps:  06/15/21 neg HR HPV neg History of abnormal Pap or positive HPV:  no Mammogram:   07/28/23 Breast Density Cat D, BIRADS Cat 1 neg  Colonoscopy:  07/11/22 due every 10 years  Bone Density:  n/a  Result  n/a   Immunization History  Administered Date(s) Administered   Influenza,inj,Quad PF,6+ Mos 06/12/2022   PFIZER SARS-COV-2 Pediatric Vaccination 5-86yrs 11/22/2019, 12/23/2019      reports that she has never smoked. She has never used smokeless tobacco. She reports that she does not currently use alcohol. She reports that she does not use drugs.  Past Medical History:  Diagnosis Date   Infertility, female    Low vitamin D  level     Past Surgical History:  Procedure Laterality Date   COLONOSCOPY      Current Outpatient Medications  Medication Sig Dispense Refill   Cholecalciferol (VITAMIN D3) 50 MCG (2000 UT) capsule Take 1 capsule (2,000 Units total) by mouth daily. 90 capsule 0   Current Facility-Administered Medications  Medication Dose Route Frequency Provider Last Rate Last Admin   0.9 %  sodium chloride  infusion  500 mL Intravenous Once Pyrtle, Gordy HERO, MD        ALLERGIES:  Patient has no known allergies.  Family History  Problem Relation Age of Onset   Cancer Father        Gall bladder   Colon cancer Neg Hx    Esophageal cancer Neg Hx    Rectal cancer Neg Hx    Stomach cancer Neg Hx     Review of Systems  All other systems reviewed and are negative.   PHYSICAL EXAM:  BP 114/64 (BP Location: Left Arm, Patient Position: Sitting)   Pulse 72   Ht 5' 4 (1.626 m)   Wt 129 lb (58.5 kg)   LMP 07/01/2024 (Approximate)   BMI 22.14 kg/m     General appearance: alert, cooperative and appears stated age Head: normocephalic, without obvious abnormality, atraumatic Neck: no adenopathy, supple, symmetrical, trachea midline and thyroid normal to inspection and palpation Lungs: clear to auscultation bilaterally Breasts: normal appearance, no masses or tenderness, No nipple retraction or dimpling, No nipple discharge or bleeding, No axillary adenopathy Heart: regular rate and rhythm Abdomen: soft, non-tender; no masses, no organomegaly Extremities: extremities normal, atraumatic, no cyanosis or edema Skin: skin color, texture, turgor normal. No rashes or lesions Lymph nodes: cervical, supraclavicular, and axillary nodes normal. Neurologic: grossly normal  Pelvic: External genitalia:  no lesions  No abnormal inguinal nodes palpated.              Urethra:  normal appearing urethra with no masses, tenderness or lesions              Bartholins and Skenes: normal                 Vagina: normal appearing vagina with normal color and discharge, no lesions              Cervix: no lesions.  Light menstrual flow.               Pap taken: no Bimanual Exam:  Uterus:  normal size, contour, position, consistency, mobility, non-tender              Adnexa: no mass, fullness, tenderness              Rectal exam: yes.  Confirms.              Anus:  normal sphincter tone, no lesions  Chaperone was present for exam:  Kari HERO, CMA  ASSESSMENT: Well woman visit  with gynecologic exam. Hx primary infertility. Low vit D.  PHQ-2-9: 0  PLAN: Mammogram screening discussed.  She will schedule.  Self breast awareness reviewed. Pap and HRV collected:  no.  Due in 2027. Guidelines for Calcium, Vitamin D , regular exercise program including cardiovascular and weight bearing exercise. Medication refills:  NA Check Vit D. She will contact her PCP to schedule routine appointment.  Follow up:  yearly and prn.

## 2024-07-05 ENCOUNTER — Ambulatory Visit (INDEPENDENT_AMBULATORY_CARE_PROVIDER_SITE_OTHER): Payer: BC Managed Care – PPO | Admitting: Obstetrics and Gynecology

## 2024-07-05 ENCOUNTER — Encounter: Payer: Self-pay | Admitting: Obstetrics and Gynecology

## 2024-07-05 VITALS — BP 114/64 | HR 72 | Ht 64.0 in | Wt 129.0 lb

## 2024-07-05 DIAGNOSIS — Z01419 Encounter for gynecological examination (general) (routine) without abnormal findings: Secondary | ICD-10-CM

## 2024-07-05 DIAGNOSIS — Z1331 Encounter for screening for depression: Secondary | ICD-10-CM

## 2024-07-05 DIAGNOSIS — R7989 Other specified abnormal findings of blood chemistry: Secondary | ICD-10-CM

## 2024-07-05 NOTE — Patient Instructions (Signed)

## 2024-07-06 ENCOUNTER — Ambulatory Visit: Payer: Self-pay | Admitting: Obstetrics and Gynecology

## 2024-07-06 LAB — VITAMIN D 25 HYDROXY (VIT D DEFICIENCY, FRACTURES): Vit D, 25-Hydroxy: 49 ng/mL (ref 30–100)

## 2024-07-15 ENCOUNTER — Other Ambulatory Visit: Payer: Self-pay | Admitting: Obstetrics and Gynecology

## 2024-07-15 DIAGNOSIS — Z1231 Encounter for screening mammogram for malignant neoplasm of breast: Secondary | ICD-10-CM

## 2024-08-06 ENCOUNTER — Ambulatory Visit
Admission: RE | Admit: 2024-08-06 | Discharge: 2024-08-06 | Disposition: A | Discharge status: Home / Self Care | Payer: Self-pay | Source: Ambulatory Visit | Attending: Obstetrics and Gynecology | Admitting: Obstetrics and Gynecology

## 2024-08-06 DIAGNOSIS — Z1231 Encounter for screening mammogram for malignant neoplasm of breast: Secondary | ICD-10-CM

## 2024-08-10 ENCOUNTER — Ambulatory Visit: Payer: Self-pay | Admitting: Obstetrics and Gynecology

## 2025-07-11 ENCOUNTER — Ambulatory Visit: Admitting: Obstetrics and Gynecology

## 2025-07-19 ENCOUNTER — Ambulatory Visit: Admitting: Obstetrics and Gynecology
